# Patient Record
Sex: Male | Born: 1960 | Race: White | Hispanic: No | Marital: Married | Smoking: Never smoker
Health system: Southern US, Community
[De-identification: ages and names within clinical notes are randomized; demographics above are authoritative.]

## PROBLEM LIST (undated history)

## (undated) DIAGNOSIS — E785 Hyperlipidemia, unspecified: Secondary | ICD-10-CM

## (undated) DIAGNOSIS — N289 Disorder of kidney and ureter, unspecified: Secondary | ICD-10-CM

## (undated) HISTORY — PX: FRACTURE SURGERY: SHX138

---

## 1997-05-17 ENCOUNTER — Encounter (HOSPITAL_COMMUNITY): Admission: RE | Admit: 1997-05-17 | Discharge: 1997-08-15 | Payer: Self-pay | Admitting: *Deleted

## 1998-11-02 ENCOUNTER — Encounter: Payer: Self-pay | Admitting: Specialist

## 1998-11-02 ENCOUNTER — Ambulatory Visit (HOSPITAL_COMMUNITY): Admission: RE | Admit: 1998-11-02 | Discharge: 1998-11-02 | Payer: Self-pay | Admitting: Specialist

## 1998-11-19 ENCOUNTER — Other Ambulatory Visit: Admission: RE | Admit: 1998-11-19 | Discharge: 1998-11-19 | Payer: Self-pay | Admitting: Specialist

## 2001-09-08 ENCOUNTER — Emergency Department (HOSPITAL_COMMUNITY): Admission: EM | Admit: 2001-09-08 | Discharge: 2001-09-08 | Payer: Self-pay | Admitting: Emergency Medicine

## 2001-09-08 ENCOUNTER — Encounter: Payer: Self-pay | Admitting: Emergency Medicine

## 2001-09-09 ENCOUNTER — Encounter: Payer: Self-pay | Admitting: Orthopedic Surgery

## 2006-01-18 ENCOUNTER — Encounter: Admission: RE | Admit: 2006-01-18 | Discharge: 2006-01-18 | Payer: Self-pay | Admitting: Specialist

## 2006-07-17 ENCOUNTER — Emergency Department (HOSPITAL_COMMUNITY): Admission: EM | Admit: 2006-07-17 | Discharge: 2006-07-17 | Payer: Self-pay | Admitting: Emergency Medicine

## 2006-07-20 ENCOUNTER — Ambulatory Visit (HOSPITAL_COMMUNITY): Admission: RE | Admit: 2006-07-20 | Discharge: 2006-07-21 | Payer: Self-pay | Admitting: Orthopedic Surgery

## 2007-01-28 ENCOUNTER — Encounter: Admission: RE | Admit: 2007-01-28 | Discharge: 2007-01-28 | Payer: Self-pay | Admitting: Orthopedic Surgery

## 2010-07-01 NOTE — Op Note (Signed)
Rick Contreras, Rick Contreras NO.:  [id number]   MEDICAL RECORD NO.:  [id number]          PATIENT TYPE:  OIB   LOCATION:  5039                         FACILITY:  [facility]   PHYSICIAN:  Dionne Ano. Gramig III, M.D.DATE OF BIRTH:  1960-03-04   DATE OF PROCEDURE:  07/20/2006  DATE OF DISCHARGE:                               OPERATIVE REPORT   PREOPERATIVE DIAGNOSIS:  Galeazzi fracture left forearm with noted prior  wrist open reduction internal fixation and fracture adjacent to retained  hardware in the left wrist.   POSTOPERATIVE DIAGNOSES:  Galeazzi fracture left forearm with noted  prior wrist open reduction internal fixation and fracture adjacent to  retained hardware in the left wrist.   PROCEDURE.:  1. Deep hardware removal left wrist in the form of a previously placed      DVR plate.  2. Open reduction internal fixation with nine hole 3.5 DCP plate left      radius fracture.  3. Manipulation under anesthesia,left forearm and wrist.  4. Extensive superficial radial nerve neurolysis, left forearm.   SURGEON:  Dionne Ano. Amanda Pea, M.D.   ASSISTANT:  None.   COMPLICATIONS:  None.   ANESTHESIA:  Infraclavicular block with IV sedation.   TOURNIQUET TIME:  Less than an hour.   ESTIMATED BLOOD LOSS:  Minimal.   DRAINS:  One.   INDICATIONS FOR PROCEDURE:  This patient is a pleasant male who presents  with the above-mentioned diagnosis.  He has had prior wrist fracture  which he did very well from in terms of healing.  Unfortunately he was  on the soccer field days ago and sustained a blow to the arm and  fracture just above his plate and sustained a resultant Galeazzi  fracture subluxation (radial shaft fracture and distal radial ulnar  joint subluxation).  Following this he presented to my office for  evaluation and treatment.  He understands the risks and benefits of  surgery, need for surgical intervention and various imponderables, pre  and postop routines, etc.  I have discussed with him extensively the  risks of bleeding, infection, anesthesia, damage to normal structures  and failure of surgery to accomplish these intended goals of relieving  symptoms and restoring function.  With this in mind he desires to  proceed.  All questions have been encouraged and answered  preoperatively.   OPERATIVE PROCEDURE:  The patient was seen by myself and anesthesia.  He  was taken to operative suite and underwent smooth induction of IV  sedation.  He was permitted, arm was marked and infraclavicular block  was placed in the holding area.  Following securing a sterile prep and  drape the patient underwent incision about his prior incision centered  over the volar radial wrist region.  This was extended proximally.  Following this, I dissected through scar tissue and identified the  interval between the radial artery and brachioradialis tendon.  The  superficial radial nerve was adhesed with scar tissue and underwent a  distinct and separate neurolysis at this time.  This was an extensive  neurolysis of the superficial radial nerve.  Following this, I then dissected out the radial artery.  The radial  artery had a vessel loop placed around it and was meticulously dissected  and removed out of harm's way.   Following this, I then accessed the deep hardware about the plate.  The  pronator quadratus was lifted off of the plate.  The plate was removed  meticulously and following removal of the DVR plate as well as screws  and pegs I then set this away from the field, irrigated the area and  released a small portion of the pronator.  Pronator was released  proximally and the two ends of the fractured bone were identified.  I  then reduced these and applied a nine hole 3.5 DCP plate under  compression mode.  This was done without difficulty and excellent  purchase with four cortices distally and 3.5 cortices proximally was  obtained.  I did not want to create  any undue stress risers and thus  backed off the most proximal screw a slight bit to achieve primarily uni  cortical fixation.  This allowed for approximately seven cortices of  fixation proximal to the fracture and eight cortices distal to the  fracture.  Following this, I checked this under live fluoro.  The distal  radioulnar joint was stable.  Manipulation under anesthesia, the wrist  forearm and distal radioulnar joint noted excellent stability.  I was  pleased with the findings.  At this time I deflated the tourniquet,  applied an 8 inch (medium) Hemovac drain and next closed the wound in  layers.  Vicryl was placed deeply followed by 3-0 Vicryl in the subcu  and skin clip at the skin edge.  Sterile dressing was applied and he was  placed in a long-arm splint with the arm fully supinated.  He tolerated  this well.  There were no complications.  All sponge and instrument  counts were reported as correct.  He will be admitted for 23-hour  observation for pain management, IV antibiotics and general postop  precautions and measures.  Will monitor his condition closely and look  forward to participating in his postop care of course.           ______________________________  Dionne Ano. Everlene Other, M.D.     Rick Contreras  D:  07/21/2006  T:  07/21/2006  Job:  [id number]

## 2010-07-04 NOTE — Consult Note (Signed)
[place]  Patient:    Rick Contreras, Rick Contreras Visit Number: [id number] MRN: [id number]          Service Type: DFT Location: *N Attending Physician:  Dominica Severin Dictated by:   Sherri Rad, M.D. Proc. Date: 09/09/01                            Consultation Report  ORTHOPEDIC CONSULTATION REPORT  CHIEF COMPLAINT:  Bilateral left greater than right wrist pain.  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  He was then taken to [facility] emergency department where x-rays were obtained and I was consulted for further evaluation and treatment.  The patient had no numbness or tingling in his right hand but had numbness and tingling in his left hand.  No significant medical problems.  PHYSICAL EXAMINATION:  His compartments were soft in his hand, forearm and arm.  Passive range of motion of fingers did not elicit any pain involving the forearm or hand just directly over the fracture site.   He had decreased sensation over the tips of his fingers initially.  Palpable radial pulse, capillary refill less than 2 seconds in all fingers.  He had an obvious extension deformity to the distal radius.  As for the right wrist he was tender over the distal radius, no deformity, some more mild to moderate swelling, mild ecchymoses.  Compartments are soft in hand, forearm and arm. Passive range of motion of fingers did not elicit any pain.  Full radial pulse, sensation intact to light touch over all fingertips.  ACCESSORY CLINICAL DATA:  X-rays revealed a comminuted right extension distal radius fracture with probable interarticular extension.  PROCEDURE:  Under sterile conditions a hematoma block was administered to the left wrist  into the fracture site.  Once this was done a closed reduction of the left distal radius was then performed.  Closed reduction x-rays showed adequate reduction.  Repeat examination post reduction revealed again that the compartments were soft.  Sensation in the fingertips improved and he still had excellent capillary refill.  Fingertips were warm.  Palpable radial pulse. Sugar-tong splint was applied.  IMPRESSION: 1. Left comminuted distal radius fracture. 2. Probable right distal radius fracture non-displaced.  PLAN:  I explained to the patient and his wife that at this point he will most likely require open reduction, internal fixation of his left distal radius fracture.  I am going to review these x-rays as well as the CT scan that we are obtaining with our hand specialist, Dr. Amanda Pea.  He is leaving out of town next week.  If he is unable to do this when he comes back then I will refer this gentleman to one of our local hand specialists.  The patient is to keep this elevated and active range of motion of the fingers is encouraged.  He is to keep it elevated at or above the level of the heart.  He was explained the signs and symptoms of compartment syndrome.  If he develops these symptoms he is to come to the emergency department immediately. Dictated by:   Sherri Rad, M.D. Attending Physician:  Dominica Severin DD:  09/09/01 TD:  09/13/01 Job: [id number]

## 2010-12-04 LAB — CBC
MCHC: 34.4
MCV: 86.2
Platelets: 291
RBC: 4.93
RDW: 12.5

## 2010-12-04 LAB — BASIC METABOLIC PANEL
CO2: 28
Calcium: 9
Chloride: 102

## 2011-01-21 ENCOUNTER — Other Ambulatory Visit: Payer: Self-pay | Admitting: Gastroenterology

## 2011-10-01 ENCOUNTER — Other Ambulatory Visit: Payer: Self-pay | Admitting: Otolaryngology

## 2013-02-01 DIAGNOSIS — F909 Attention-deficit hyperactivity disorder, unspecified type: Secondary | ICD-10-CM

## 2013-02-01 DIAGNOSIS — F411 Generalized anxiety disorder: Secondary | ICD-10-CM

## 2013-02-01 DIAGNOSIS — R413 Other amnesia: Secondary | ICD-10-CM

## 2013-02-01 DIAGNOSIS — F332 Major depressive disorder, recurrent severe without psychotic features: Secondary | ICD-10-CM

## 2013-02-03 DIAGNOSIS — F411 Generalized anxiety disorder: Secondary | ICD-10-CM

## 2013-02-03 DIAGNOSIS — F909 Attention-deficit hyperactivity disorder, unspecified type: Secondary | ICD-10-CM

## 2013-02-03 DIAGNOSIS — R413 Other amnesia: Secondary | ICD-10-CM

## 2013-02-03 DIAGNOSIS — F332 Major depressive disorder, recurrent severe without psychotic features: Secondary | ICD-10-CM

## 2013-02-23 DIAGNOSIS — F332 Major depressive disorder, recurrent severe without psychotic features: Secondary | ICD-10-CM

## 2013-02-23 DIAGNOSIS — R413 Other amnesia: Secondary | ICD-10-CM

## 2013-02-23 DIAGNOSIS — F411 Generalized anxiety disorder: Secondary | ICD-10-CM

## 2013-03-31 ENCOUNTER — Other Ambulatory Visit (HOSPITAL_COMMUNITY): Payer: Self-pay | Admitting: Internal Medicine

## 2013-03-31 DIAGNOSIS — J45990 Exercise induced bronchospasm: Secondary | ICD-10-CM

## 2013-03-31 LAB — PULMONARY FUNCTION TEST
DL/VA % PRED: 88 %
DL/VA: 4.14 ml/min/mmHg/L
DLCO COR: 32.44 ml/min/mmHg
DLCO UNC % PRED: 98 %
DLCO UNC: 32.44 ml/min/mmHg
DLCO cor % pred: 98 %
FEF 25-75 POST: 3.5 L/s
FEF 25-75 PRE: 3.1 L/s
FEF2575-%CHANGE-POST: 12 %
FEF2575-%Pred-Post: 103 %
FEF2575-%Pred-Pre: 91 %
FEV1-%CHANGE-POST: 2 %
FEV1-%PRED-POST: 105 %
FEV1-%PRED-PRE: 102 %
FEV1-POST: 4.1 L
FEV1-Pre: 4 L
FEV1FVC-%Change-Post: 1 %
FEV1FVC-%PRED-PRE: 97 %
FEV6-%CHANGE-POST: 1 %
FEV6-%PRED-PRE: 109 %
FEV6-%Pred-Post: 110 %
FEV6-Post: 5.37 L
FEV6-Pre: 5.32 L
FEV6FVC-%CHANGE-POST: 0 %
FEV6FVC-%PRED-POST: 103 %
FEV6FVC-%Pred-Pre: 103 %
FVC-%CHANGE-POST: 1 %
FVC-%Pred-Post: 106 %
FVC-%Pred-Pre: 105 %
FVC-Post: 5.41 L
FVC-Pre: 5.34 L
PRE FEV1/FVC RATIO: 75 %
Post FEV1/FVC ratio: 76 %
Post FEV6/FVC ratio: 99 %
Pre FEV6/FVC Ratio: 100 %
RV % pred: 174 %
RV: 3.7 L
TLC % PRED: 130 %
TLC: 9.24 L

## 2013-04-10 ENCOUNTER — Ambulatory Visit (HOSPITAL_COMMUNITY)
Admission: RE | Admit: 2013-04-10 | Discharge: 2013-04-10 | Disposition: A | Discharge status: Home / Self Care | Payer: 59 | Source: Ambulatory Visit | Attending: Internal Medicine | Admitting: Internal Medicine

## 2013-04-10 ENCOUNTER — Inpatient Hospital Stay (HOSPITAL_COMMUNITY)
Admission: RE | Admit: 2013-04-10 | Discharge: 2013-04-10 | Disposition: A | Discharge status: Home / Self Care | Payer: 59 | Source: Ambulatory Visit

## 2013-04-10 DIAGNOSIS — J45909 Unspecified asthma, uncomplicated: Secondary | ICD-10-CM | POA: Insufficient documentation

## 2013-04-10 MED ORDER — ALBUTEROL SULFATE (2.5 MG/3ML) 0.083% IN NEBU
2.5000 mg | INHALATION_SOLUTION | Freq: Once | RESPIRATORY_TRACT | Status: AC
  Administered 2013-04-10: 2.5 mg via RESPIRATORY_TRACT

## 2015-03-06 ENCOUNTER — Other Ambulatory Visit: Payer: Self-pay | Admitting: Internal Medicine

## 2015-03-06 DIAGNOSIS — R112 Nausea with vomiting, unspecified: Secondary | ICD-10-CM

## 2015-03-07 ENCOUNTER — Ambulatory Visit (HOSPITAL_COMMUNITY)
Admission: RE | Admit: 2015-03-07 | Discharge: 2015-03-07 | Disposition: A | Discharge status: Home / Self Care | Payer: 59 | Source: Ambulatory Visit | Attending: Internal Medicine | Admitting: Internal Medicine

## 2015-03-07 DIAGNOSIS — R160 Hepatomegaly, not elsewhere classified: Secondary | ICD-10-CM | POA: Insufficient documentation

## 2015-03-07 DIAGNOSIS — R112 Nausea with vomiting, unspecified: Secondary | ICD-10-CM | POA: Diagnosis present

## 2016-03-31 ENCOUNTER — Other Ambulatory Visit: Payer: Self-pay | Admitting: Internal Medicine

## 2016-03-31 DIAGNOSIS — I701 Atherosclerosis of renal artery: Secondary | ICD-10-CM

## 2016-04-07 ENCOUNTER — Ambulatory Visit
Admission: RE | Admit: 2016-04-07 | Discharge: 2016-04-07 | Disposition: A | Discharge status: Home / Self Care | Payer: 59 | Source: Ambulatory Visit | Attending: Internal Medicine | Admitting: Internal Medicine

## 2016-04-07 ENCOUNTER — Ambulatory Visit
Admission: RE | Admit: 2016-04-07 | Discharge: 2016-04-07 | Disposition: A | Discharge status: Home / Self Care | Payer: BLUE CROSS/BLUE SHIELD | Source: Ambulatory Visit | Attending: Internal Medicine | Admitting: Internal Medicine

## 2016-04-07 DIAGNOSIS — I701 Atherosclerosis of renal artery: Secondary | ICD-10-CM

## 2016-10-21 ENCOUNTER — Emergency Department (HOSPITAL_COMMUNITY)
Admission: EM | Admit: 2016-10-21 | Discharge: 2016-10-21 | Disposition: A | Discharge status: Home / Self Care | Payer: BLUE CROSS/BLUE SHIELD | Attending: Emergency Medicine | Admitting: Emergency Medicine

## 2016-10-21 ENCOUNTER — Encounter (HOSPITAL_COMMUNITY): Payer: Self-pay | Admitting: Emergency Medicine

## 2016-10-21 ENCOUNTER — Emergency Department (HOSPITAL_COMMUNITY): Payer: BLUE CROSS/BLUE SHIELD

## 2016-10-21 DIAGNOSIS — M545 Low back pain: Secondary | ICD-10-CM | POA: Diagnosis not present

## 2016-10-21 DIAGNOSIS — N200 Calculus of kidney: Secondary | ICD-10-CM | POA: Diagnosis not present

## 2016-10-21 DIAGNOSIS — R1032 Left lower quadrant pain: Secondary | ICD-10-CM | POA: Diagnosis present

## 2016-10-21 HISTORY — DX: Hyperlipidemia, unspecified: E78.5

## 2016-10-21 HISTORY — DX: Disorder of kidney and ureter, unspecified: N28.9

## 2016-10-21 LAB — CBC WITH DIFFERENTIAL/PLATELET
Basophils Absolute: 0 10*3/uL (ref 0.0–0.1)
Basophils Relative: 0 %
Eosinophils Absolute: 0 10*3/uL (ref 0.0–0.7)
Eosinophils Relative: 1 %
HCT: 43 % (ref 39.0–52.0)
Hemoglobin: 14.4 g/dL (ref 13.0–17.0)
Lymphocytes Relative: 8 %
Lymphs Abs: 0.6 10*3/uL — ABNORMAL LOW (ref 0.7–4.0)
MCH: 28.9 pg (ref 26.0–34.0)
MCHC: 33.5 g/dL (ref 30.0–36.0)
MCV: 86.2 fL (ref 78.0–100.0)
Monocytes Absolute: 0.5 10*3/uL (ref 0.1–1.0)
Monocytes Relative: 7 %
Neutro Abs: 6.9 10*3/uL (ref 1.7–7.7)
Neutrophils Relative %: 84 %
Platelets: 211 10*3/uL (ref 150–400)
RBC: 4.99 MIL/uL (ref 4.22–5.81)
RDW: 13.4 % (ref 11.5–15.5)
WBC: 8.1 10*3/uL (ref 4.0–10.5)

## 2016-10-21 LAB — URINALYSIS, ROUTINE W REFLEX MICROSCOPIC
Bilirubin Urine: NEGATIVE
Glucose, UA: NEGATIVE mg/dL
Ketones, ur: NEGATIVE mg/dL
Leukocytes, UA: NEGATIVE
Nitrite: NEGATIVE
Protein, ur: NEGATIVE mg/dL
Specific Gravity, Urine: 1.025 (ref 1.005–1.030)
pH: 5 (ref 5.0–8.0)

## 2016-10-21 LAB — BASIC METABOLIC PANEL
Anion gap: 8 (ref 5–15)
BUN: 24 mg/dL — ABNORMAL HIGH (ref 6–20)
CO2: 24 mmol/L (ref 22–32)
Calcium: 8.8 mg/dL — ABNORMAL LOW (ref 8.9–10.3)
Chloride: 107 mmol/L (ref 101–111)
Creatinine, Ser: 1.35 mg/dL — ABNORMAL HIGH (ref 0.61–1.24)
GFR calc Af Amer: >60 mL/min (ref >60)
GFR calc non Af Amer: 57 mL/min — ABNORMAL LOW (ref >60)
Glucose, Bld: 132 mg/dL — ABNORMAL HIGH (ref 65–99)
Potassium: 4.2 mmol/L (ref 3.5–5.1)
Sodium: 139 mmol/L (ref 135–145)

## 2016-10-21 MED ORDER — OXYCODONE-ACETAMINOPHEN 5-325 MG PO TABS
1.0000 | ORAL_TABLET | Freq: Once | ORAL | Status: AC
  Administered 2016-10-21: 1 via ORAL
  Filled 2016-10-21: qty 1

## 2016-10-21 MED ORDER — KETOROLAC TROMETHAMINE 30 MG/ML IJ SOLN
30.0000 mg | Freq: Once | INTRAMUSCULAR | Status: AC
  Administered 2016-10-21: 30 mg via INTRAVENOUS
  Filled 2016-10-21: qty 1

## 2016-10-21 MED ORDER — OXYCODONE-ACETAMINOPHEN 5-325 MG PO TABS: 1.0000 | ORAL_TABLET | ORAL | 0 refills | Status: AC | PRN

## 2016-10-21 MED ORDER — KETOROLAC TROMETHAMINE 10 MG PO TABS: 10.0000 mg | ORAL_TABLET | Freq: Four times a day (QID) | ORAL | 0 refills | Status: AC | PRN

## 2016-10-21 MED ORDER — HYDROMORPHONE HCL 1 MG/ML IJ SOLN
1.0000 mg | Freq: Once | INTRAMUSCULAR | Status: AC
  Administered 2016-10-21: 1 mg via INTRAVENOUS
  Filled 2016-10-21: qty 1

## 2016-10-21 NOTE — ED Triage Notes (Signed)
PT has severe left flank pain that radiates to front of abdomen. No history of kidney stones. PT reports sudden onset of pain this morning.   PT was given of fentanyl in route. PT also had a 4mg  ODT zofran in route.

## 2016-10-21 NOTE — ED Provider Notes (Signed)
[facility]EMERGENCY DEPT Provider Note   CSN: [id number] Arrival date & time: 10/21/16  1036     History   Chief Complaint Chief Complaint  Patient presents with  . Flank Pain    HPI Rick Contreras is a 56 y.o. male.  HPI    64 YOM presents today with severe left flank pain.  Patient notes that over the last several days he has had left lower back pain with radiation to his leg.  He notes a history of sciatica.  Patient notes that this morning he had acute onset of left flank pain with radiation down into his groin and penis.  He notes this is different from his back pain.  He denies any history of the same.  He denies any dysuria, fever.  He notes nausea and vomiting after onset of pain.  Patient denies any significant abdominal pain.   Prior to arrival patient received fentanyl in route which did not improve his symptoms.  Past Medical History:  Diagnosis Date  . Hyperlipidemia   . Renal disorder     There are no active problems to display for this patient.   Past Surgical History:  Procedure Laterality Date  . FRACTURE SURGERY       Home Medications    Prior to Admission medications   Medication Sig Start Date End Date Taking? Authorizing Provider  ketorolac (TORADOL) 10 MG tablet Take 1 tablet (10 mg total) by mouth every 6 (six) hours as needed. 10/21/16   Edouard Gikas, Tinnie Gens, PA-C  oxyCODONE-acetaminophen (PERCOCET/ROXICET) 5-325 MG tablet Take 1 tablet by mouth every 4 (four) hours as needed for severe pain. 10/21/16   Eyvonne Mechanic, PA-C    Family History No family history on file.  Social History Social History  Substance Use Topics  . Smoking status: Never Smoker  . Smokeless tobacco: Never Used  . Alcohol use No     Allergies   Patient has no known allergies.   Review of Systems Review of Systems  All other systems reviewed and are negative.   Physical Exam Updated Vital Signs BP 121/88   Pulse 66   Resp 20   Wt 106.6 kg (235 lb)   SpO2 98%     Physical Exam  Constitutional: He is oriented to person, place, and time. He appears well-developed and well-nourished.  Patient writhing in exam bed  HENT:  Head: Normocephalic and atraumatic.  Eyes: Pupils are equal, round, and reactive to light. Conjunctivae are normal. Right eye exhibits no discharge. Left eye exhibits no discharge. No scleral icterus.  Neck: Normal range of motion. No JVD present. No tracheal deviation present.  Pulmonary/Chest: Effort normal. No stridor.  Abdominal: Soft. He exhibits no distension and no mass. There is no tenderness. There is no rebound and no guarding. No hernia.  Neurological: He is alert and oriented to person, place, and time. Coordination normal.  Psychiatric: He has a normal mood and affect. His behavior is normal. Judgment and thought content normal.  Nursing note and vitals reviewed.   ED Treatments / Results  Labs (all labs ordered are listed, but only abnormal results are displayed) Labs Reviewed  CBC WITH DIFFERENTIAL/PLATELET - Abnormal; Notable for the following:       Result Value   Lymphs Abs 0.6 (*)    All other components within normal limits  BASIC METABOLIC PANEL - Abnormal; Notable for the following:    Glucose, Bld 132 (*)    BUN 24 (*)    Creatinine,  Ser 1.35 (*)    Calcium 8.8 (*)    GFR calc non Af Amer 57 (*)    All other components within normal limits  URINALYSIS, ROUTINE W REFLEX MICROSCOPIC - Abnormal; Notable for the following:    APPearance HAZY (*)    Hgb urine dipstick LARGE (*)    Bacteria, UA RARE (*)    Squamous Epithelial / LPF 0-5 (*)    All other components within normal limits    EKG  EKG Interpretation None       Radiology Ct Renal Stone Study  Result Date: 10/21/2016 CLINICAL DATA:  Left flank pain.  Left lower quadrant pain. EXAM: CT ABDOMEN AND PELVIS WITHOUT CONTRAST TECHNIQUE: Multidetector CT imaging of the abdomen and pelvis was performed following the standard protocol without IV  contrast. COMPARISON:  04/07/2016 renal ultrasound.  No prior CT. FINDINGS: Lower chest: 3 mm right middle lobe pulmonary nodule on image 6/series 5. A 3 mm subpleural right lower lobe pulmonary nodule on image 20/series 5. Normal heart size without pericardial or pleural effusion. Hepatobiliary: Mild hepatic steatosis with sparing adjacent the gallbladder. Normal gallbladder, without biliary ductal dilatation. Pancreas: Normal, without mass or ductal dilatation. Spleen: Normal in size, without focal abnormality. Adrenals/Urinary Tract: Normal adrenal glands. No renal calculi or hydronephrosis. Subtle left ureteric dilatation with a punctate stone identified in the left bladder base including on coronal image 76 and transverse image 86. Stomach/Bowel: Normal stomach, without wall thickening. Scattered colonic diverticula. Normal terminal ileum and appendix. Normal small bowel. Vascular/Lymphatic: Aortic atherosclerosis. No abdominopelvic adenopathy. Reproductive: Normal prostate. Other: Small bilateral fat containing inguinal hernias. No significant free fluid. Musculoskeletal: No acute osseous abnormality. IMPRESSION: 1. Left bladder base punctate stone. Given clinical history and subtle left hydroureter, likely recently passed from the left sided system. 2.  Aortic Atherosclerosis (ICD10-I70.0). 3. Hepatic steatosis. 4. Small right-sided pulmonary nodules. No follow-up needed if patient is low-risk. Non-contrast chest CT can be considered in 12 months if patient is high-risk. This recommendation follows the consensus statement: Guidelines for Management of Incidental Pulmonary Nodules Detected on CT Images: From the Fleischner Society 2017; Radiology 2017; 284:228-243. Electronically Signed   By: Jeronimo Greaves M.D.   On: 10/21/2016 11:54    Procedures Procedures (including critical care time)  Medications Ordered in ED Medications  ketorolac (TORADOL) 30 MG/ML injection 30 mg (30 mg Intravenous Given 10/21/16  1042)  HYDROmorphone (DILAUDID) injection 1 mg (1 mg Intravenous Given 10/21/16 1145)  oxyCODONE-acetaminophen (PERCOCET/ROXICET) 5-325 MG per tablet 1 tablet (1 tablet Oral Given 10/21/16 1323)     Initial Impression / Assessment and Plan / ED Course  I have reviewed the triage vital signs and the nursing notes.  Pertinent labs & imaging results that were available during my care of the patient were reviewed by me and considered in my medical decision making (see chart for details).     Final Clinical Impressions(s) / ED Diagnoses   Final diagnoses:  Kidney stones     Labs: CBC, BMP, urinalysis  Imaging: CT renal  Consults:  Therapeutics: Toradol (fentanyl prior to arrival)  Discharge Meds: Percocet, Toradol  Assessment/Plan: 56 year old male presents today with likely nephrolithiasis.  Patient had improvement in symptoms with Toradol here in the ED.  Patient was also given Dilaudid and Percocet.  Patient has what appears to be a passed stone as evidenced by CT findings.  Patient has no significant findings including hydronephrosis or urinary tract infection.  His pain is improved, he will be  discharged home on the above pain medication.  Patient had slight elevation in creatinine, he will follow-up as an outpatient for reassessment.  All CT findings were read to patient including incidental findings he will follow-up with his primary care for these.  Patient verbalized understanding and agreement to today's plan had no further questions or concerns the time discharge.     New Prescriptions New Prescriptions   KETOROLAC (TORADOL) 10 MG TABLET    Take 1 tablet (10 mg total) by mouth every 6 (six) hours as needed.   OXYCODONE-ACETAMINOPHEN (PERCOCET/ROXICET) 5-325 MG TABLET    Take 1 tablet by mouth every 4 (four) hours as needed for severe pain.     Eyvonne MechanicHedges, Marcey Persad, PA-C 10/21/16 1426    Donnetta Hutchingook, Brian, MD 10/24/16 [phone]

## 2016-10-21 NOTE — Discharge Instructions (Signed)
Please read attached information. If you experience any new or worsening signs or symptoms please return to the emergency room for evaluation. Please follow-up with your primary care provider or specialist as discussed. Please use medication prescribed only as directed and discontinue taking if you have any concerning signs or symptoms.   °

## 2016-10-21 NOTE — ED Notes (Signed)
Patient transported to CT 

## 2016-12-15 ENCOUNTER — Ambulatory Visit
Admission: RE | Admit: 2016-12-15 | Discharge: 2016-12-15 | Disposition: A | Discharge status: Home / Self Care | Payer: BLUE CROSS/BLUE SHIELD | Source: Ambulatory Visit | Attending: Internal Medicine | Admitting: Internal Medicine

## 2016-12-15 ENCOUNTER — Other Ambulatory Visit: Payer: Self-pay | Admitting: Internal Medicine

## 2016-12-15 DIAGNOSIS — M545 Low back pain: Secondary | ICD-10-CM

## 2017-09-26 IMAGING — CT CT RENAL STONE PROTOCOL
2 of 4 series · 16 of 46 positions shown, 18 images · non-contrast
Comparison: 04/07/2016 renal ultrasound.  No prior CT.

CLINICAL DATA: Left flank pain.  Left lower quadrant pain.

EXAM:
CT ABDOMEN AND PELVIS WITHOUT CONTRAST
TECHNIQUE: Multidetector CT imaging of the abdomen and pelvis was performed
following the standard protocol without IV contrast.

[Series 3: ap without · axial · non-contrast · 0.80mm/px · z∈[+764,+1239]mm · 13 of 109 slices shown, 15 images]
[im 7/109  soft-tissue]
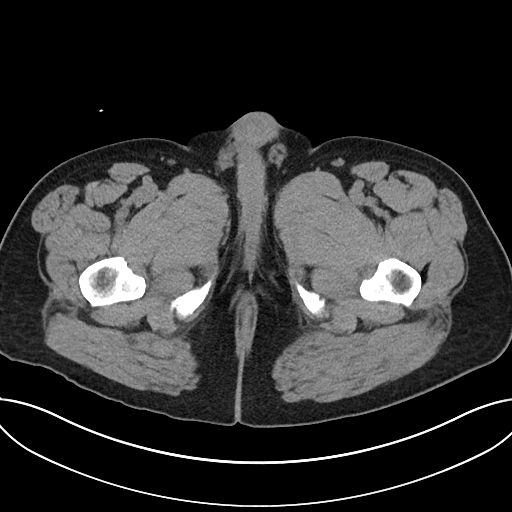
[im 7/109  bone]
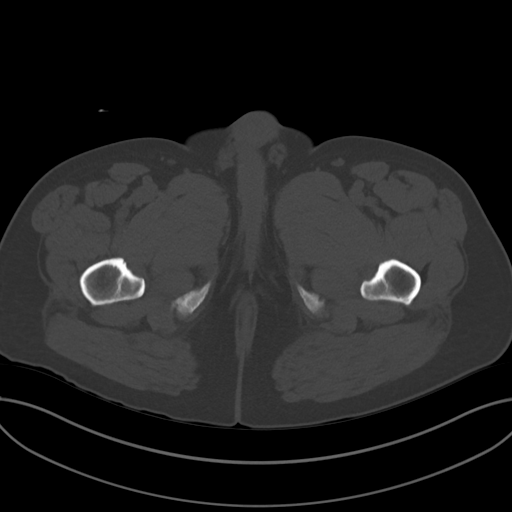
[im 13/109  soft-tissue]
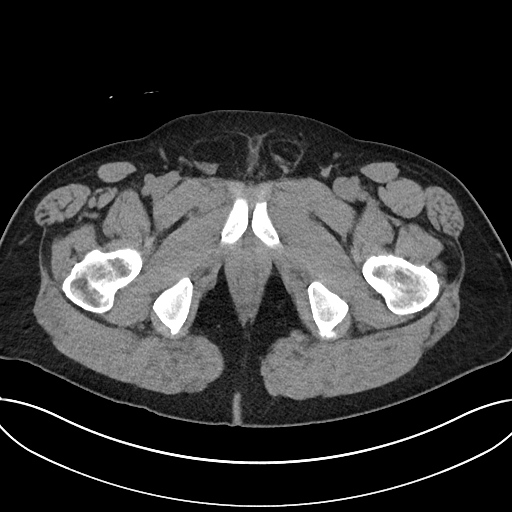
[im 26/109  soft-tissue]
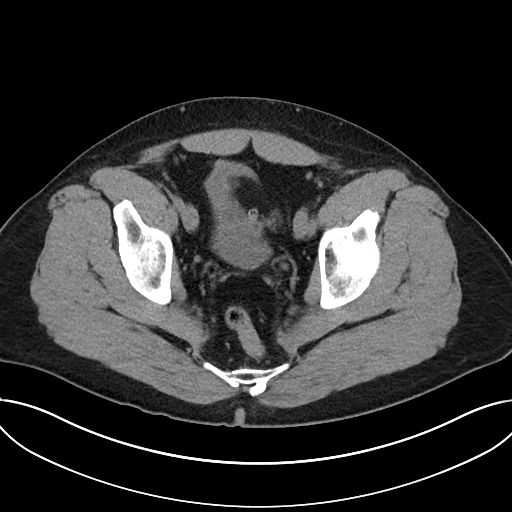
[im 32/109  soft-tissue]
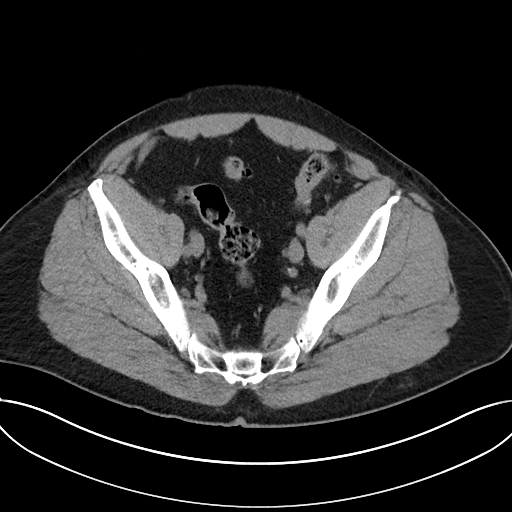
[im 39/109  soft-tissue]
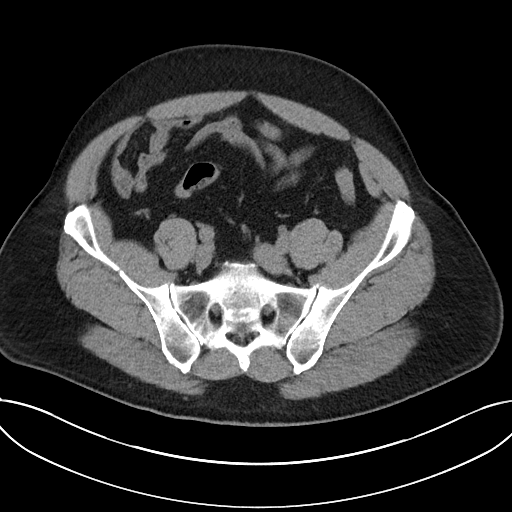
[im 45/109  soft-tissue]
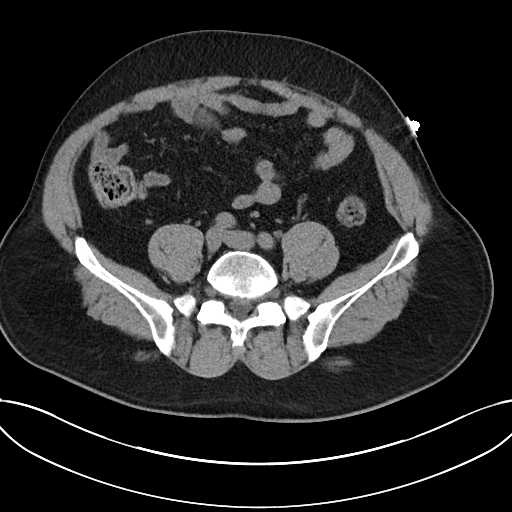
[im 58/109  soft-tissue]
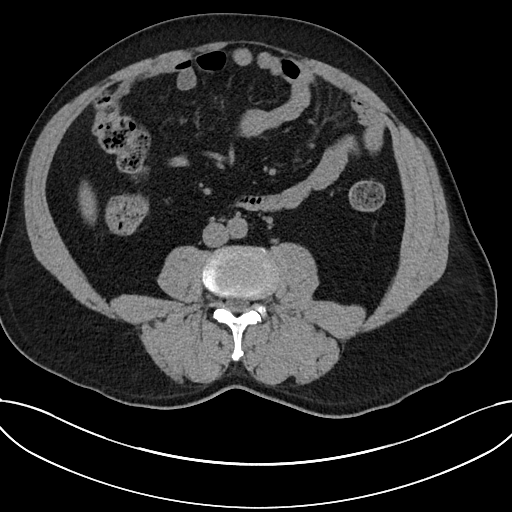
[im 64/109  soft-tissue]
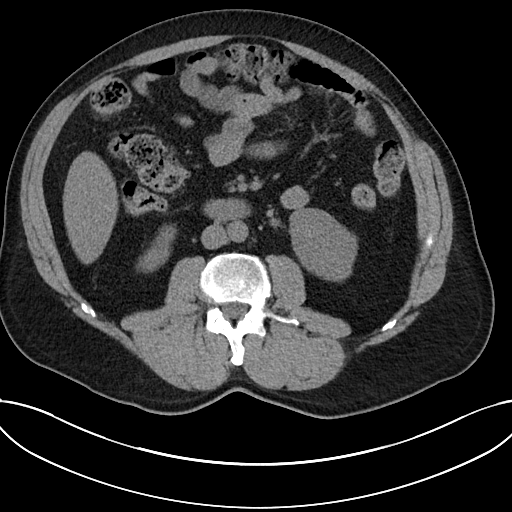
[im 70/109  soft-tissue]
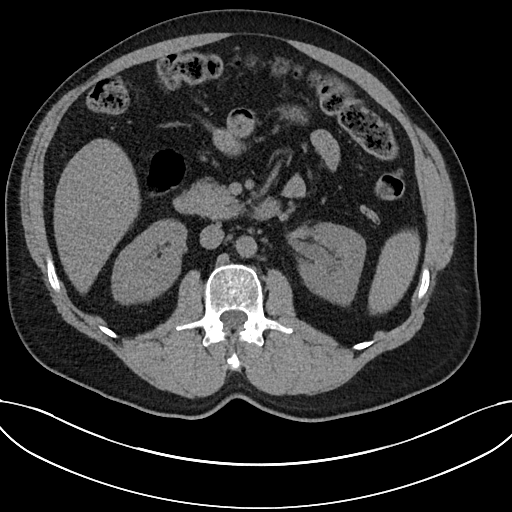
[im 70/109  bone]
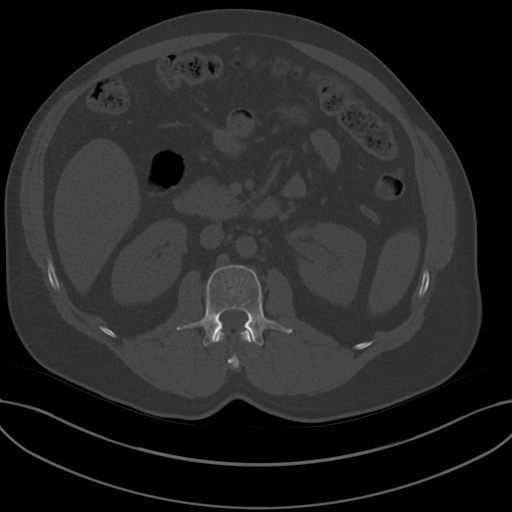
[im 77/109  soft-tissue]
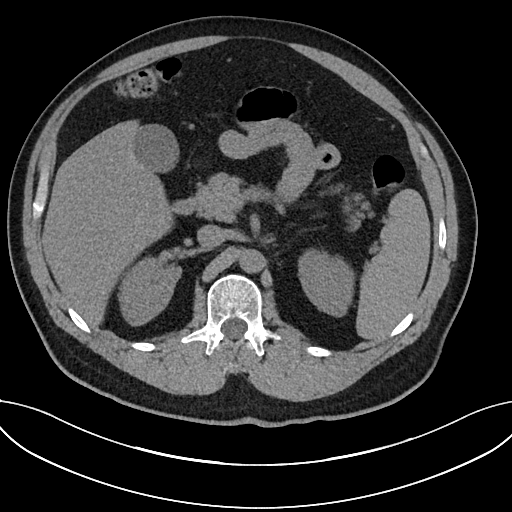
[im 83/109  soft-tissue]
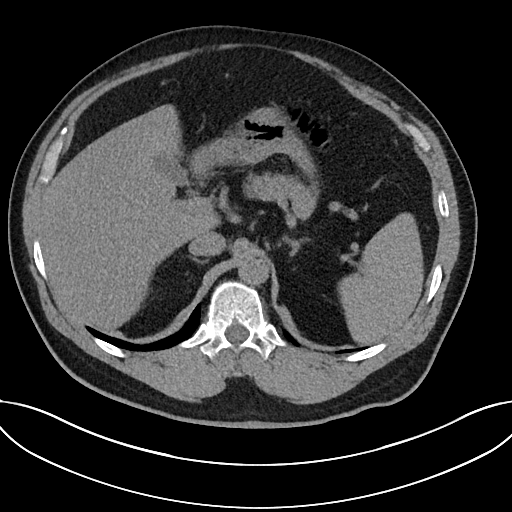
[im 96/109  soft-tissue]
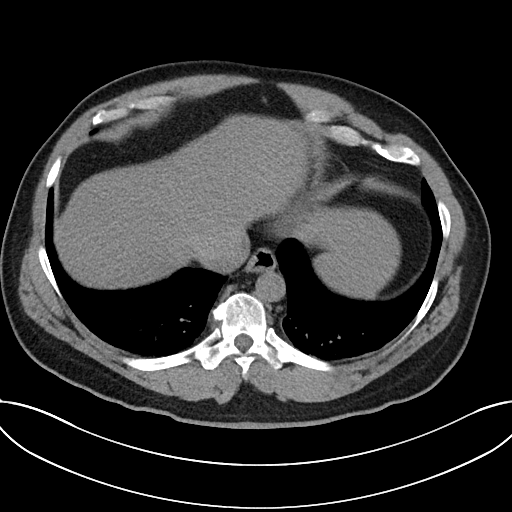
[im 102/109  soft-tissue]
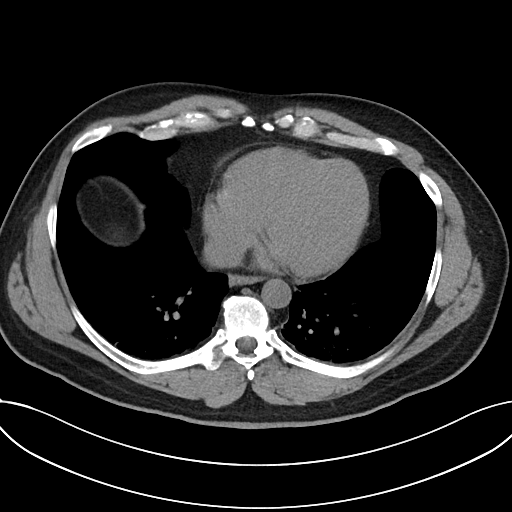

[Series 6: cor · coronal · 0.92mm/px · 3 of 123 slices shown]
[im 41/123  soft-tissue]
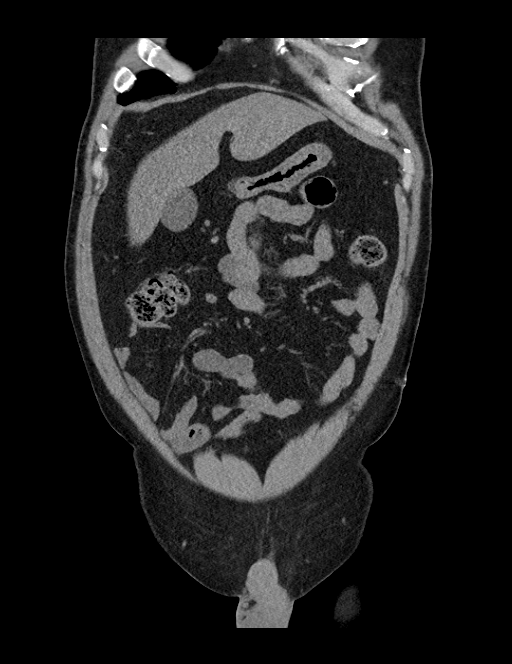
[im 55/123  soft-tissue]
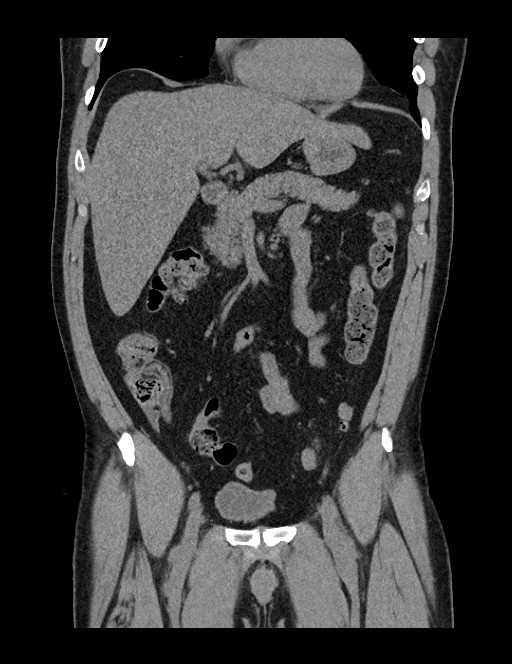
[im 68/123  soft-tissue]
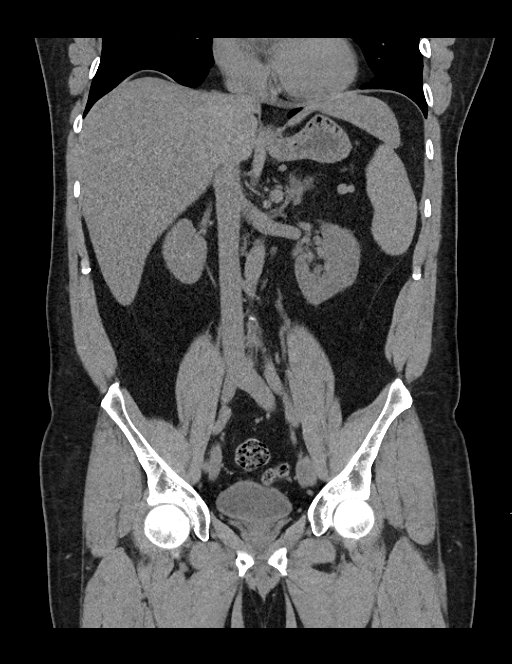

[16 of 46 positions shown; findings below may reference images not displayed]

FINDINGS: Lower chest: 3 mm right middle lobe pulmonary nodule on image
6/series 5. A 3 mm subpleural right lower lobe pulmonary nodule on
image 20/series 5. Normal heart size without pericardial or pleural
effusion.

Hepatobiliary: Mild hepatic steatosis with sparing adjacent the
gallbladder. Normal gallbladder, without biliary ductal dilatation.

Pancreas: Normal, without mass or ductal dilatation.

Spleen: Normal in size, without focal abnormality.

Adrenals/Urinary Tract: Normal adrenal glands. No renal calculi or
hydronephrosis. Subtle left ureteric dilatation with a punctate
stone identified in the left bladder base including on coronal image
76 and transverse image 86.

Stomach/Bowel: Normal stomach, without wall thickening. Scattered
colonic diverticula. Normal terminal ileum and appendix. Normal
small bowel.

Vascular/Lymphatic: Aortic atherosclerosis. No abdominopelvic
adenopathy.

Reproductive: Normal prostate.

Other: Small bilateral fat containing inguinal hernias. No
significant free fluid.

Musculoskeletal: No acute osseous abnormality.
IMPRESSION: 1. Left bladder base punctate stone. Given clinical history and
subtle left hydroureter, likely recently passed from the left sided
system.
2.  Aortic Atherosclerosis ([id number]).
3. Hepatic steatosis.
4. Small right-sided pulmonary nodules. No follow-up needed if
patient is low-risk. Non-contrast chest CT can be considered in 12
months if patient is high-risk. This recommendation follows the
consensus statement: Guidelines for Management of Incidental
Pulmonary Nodules Detected on CT Images: From the [HOSPITAL]

## 2017-11-20 IMAGING — DX DG SI JOINTS 3+V
3 series · 3 of 3 positions shown · non-contrast
Comparison: Coronal and sagittal CT images from a scan of October 21, 2016.

CLINICAL DATA: Intermittent low back pain for the past 3 months. No
previous history of injury

EXAM:
BILATERAL SACROILIAC JOINTS - 3+ VIEW

[dg si joints (1 of 3)]
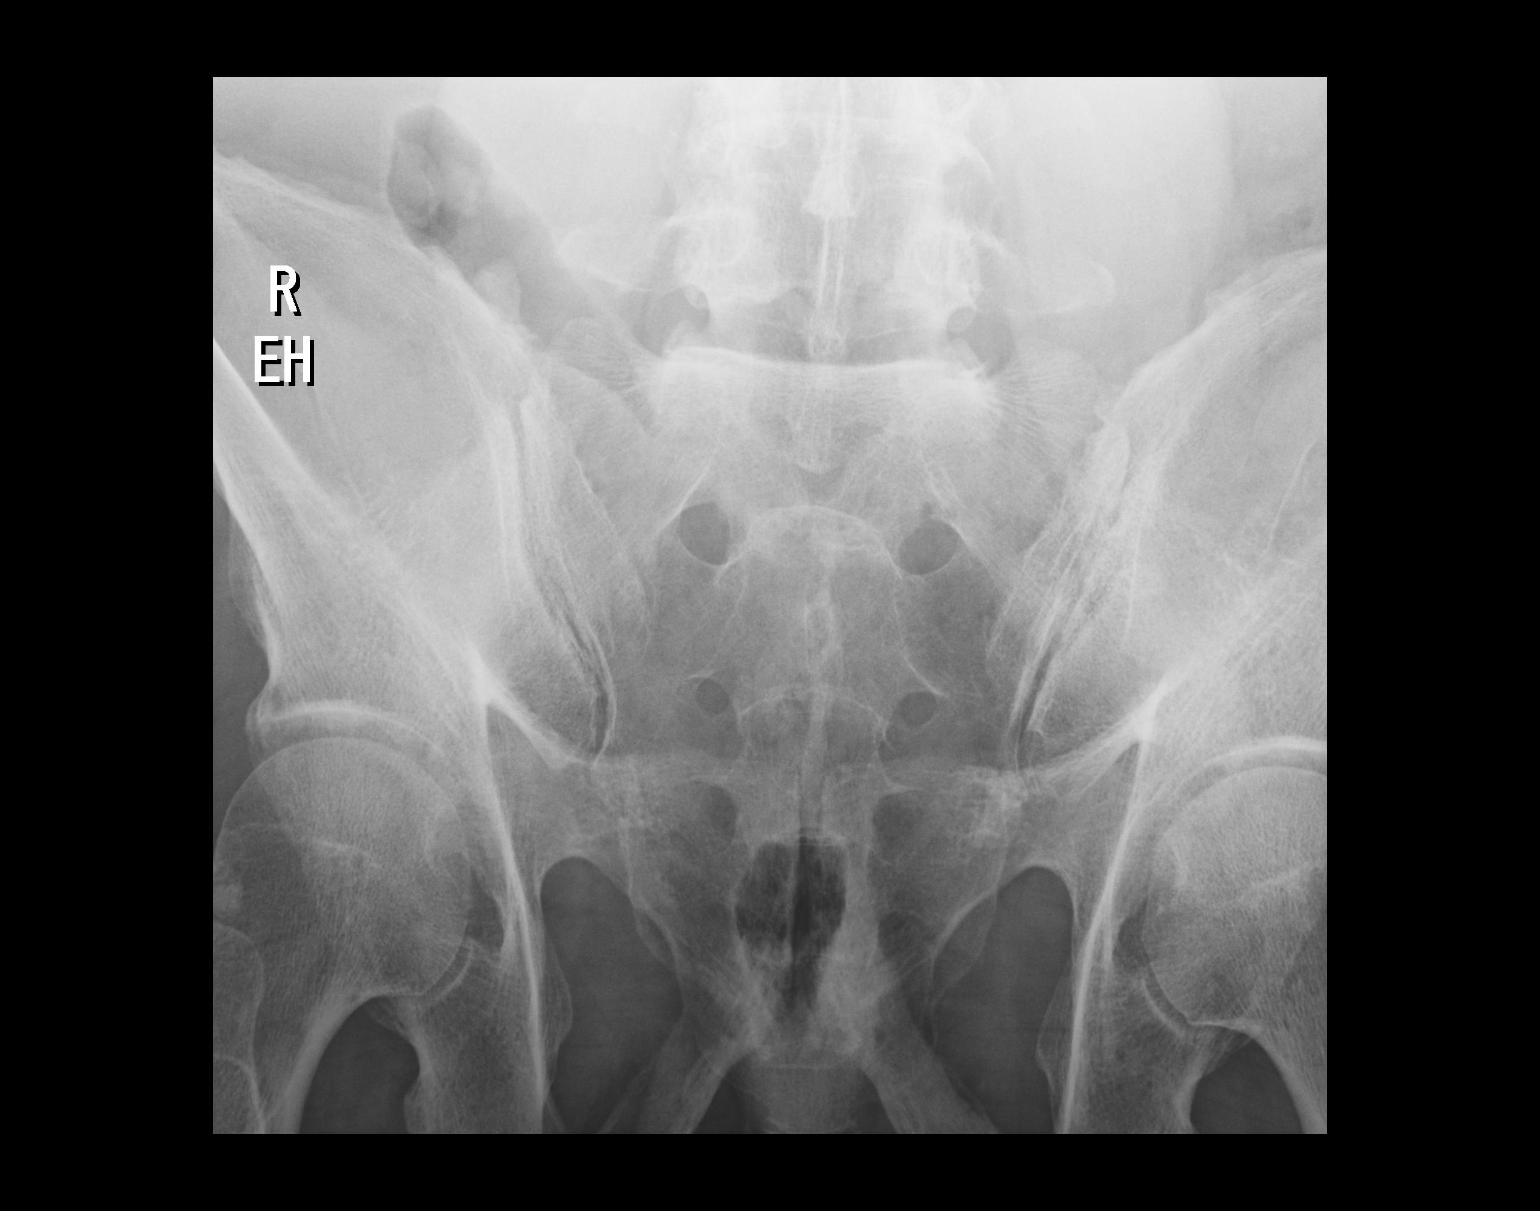

[dg si joints (2 of 3)]
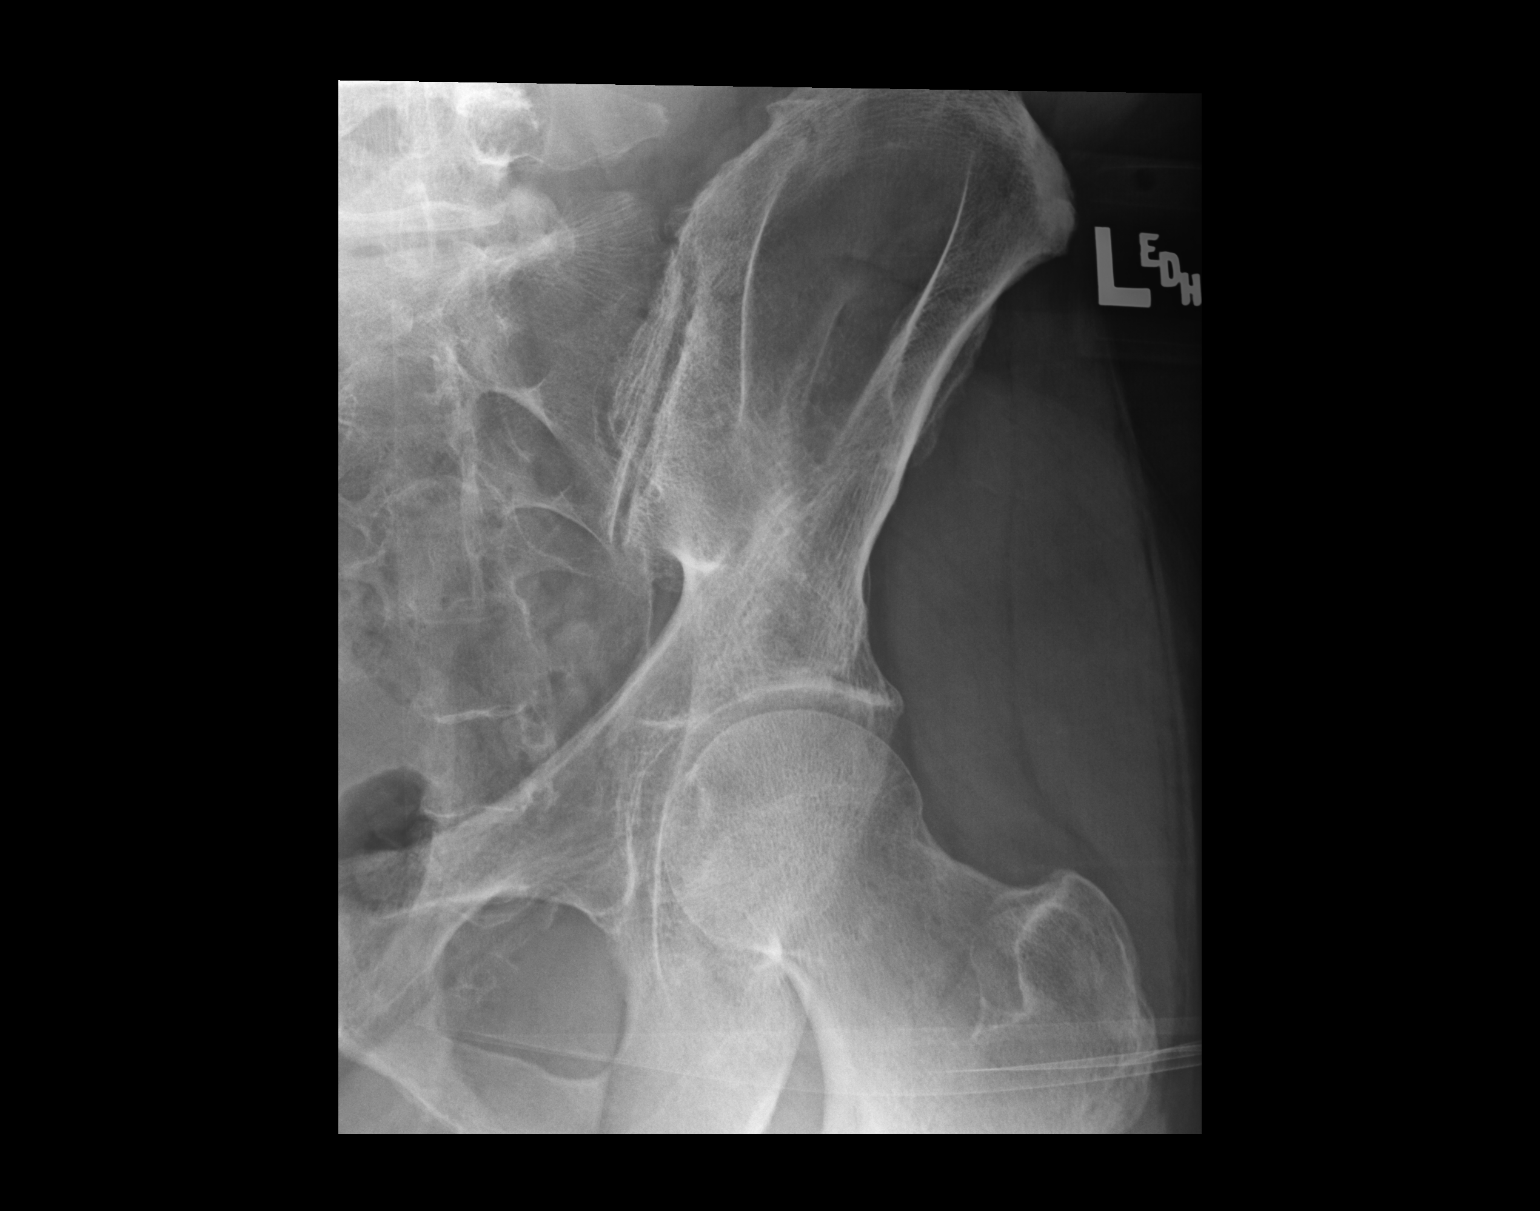

[dg si joints (3 of 3)]
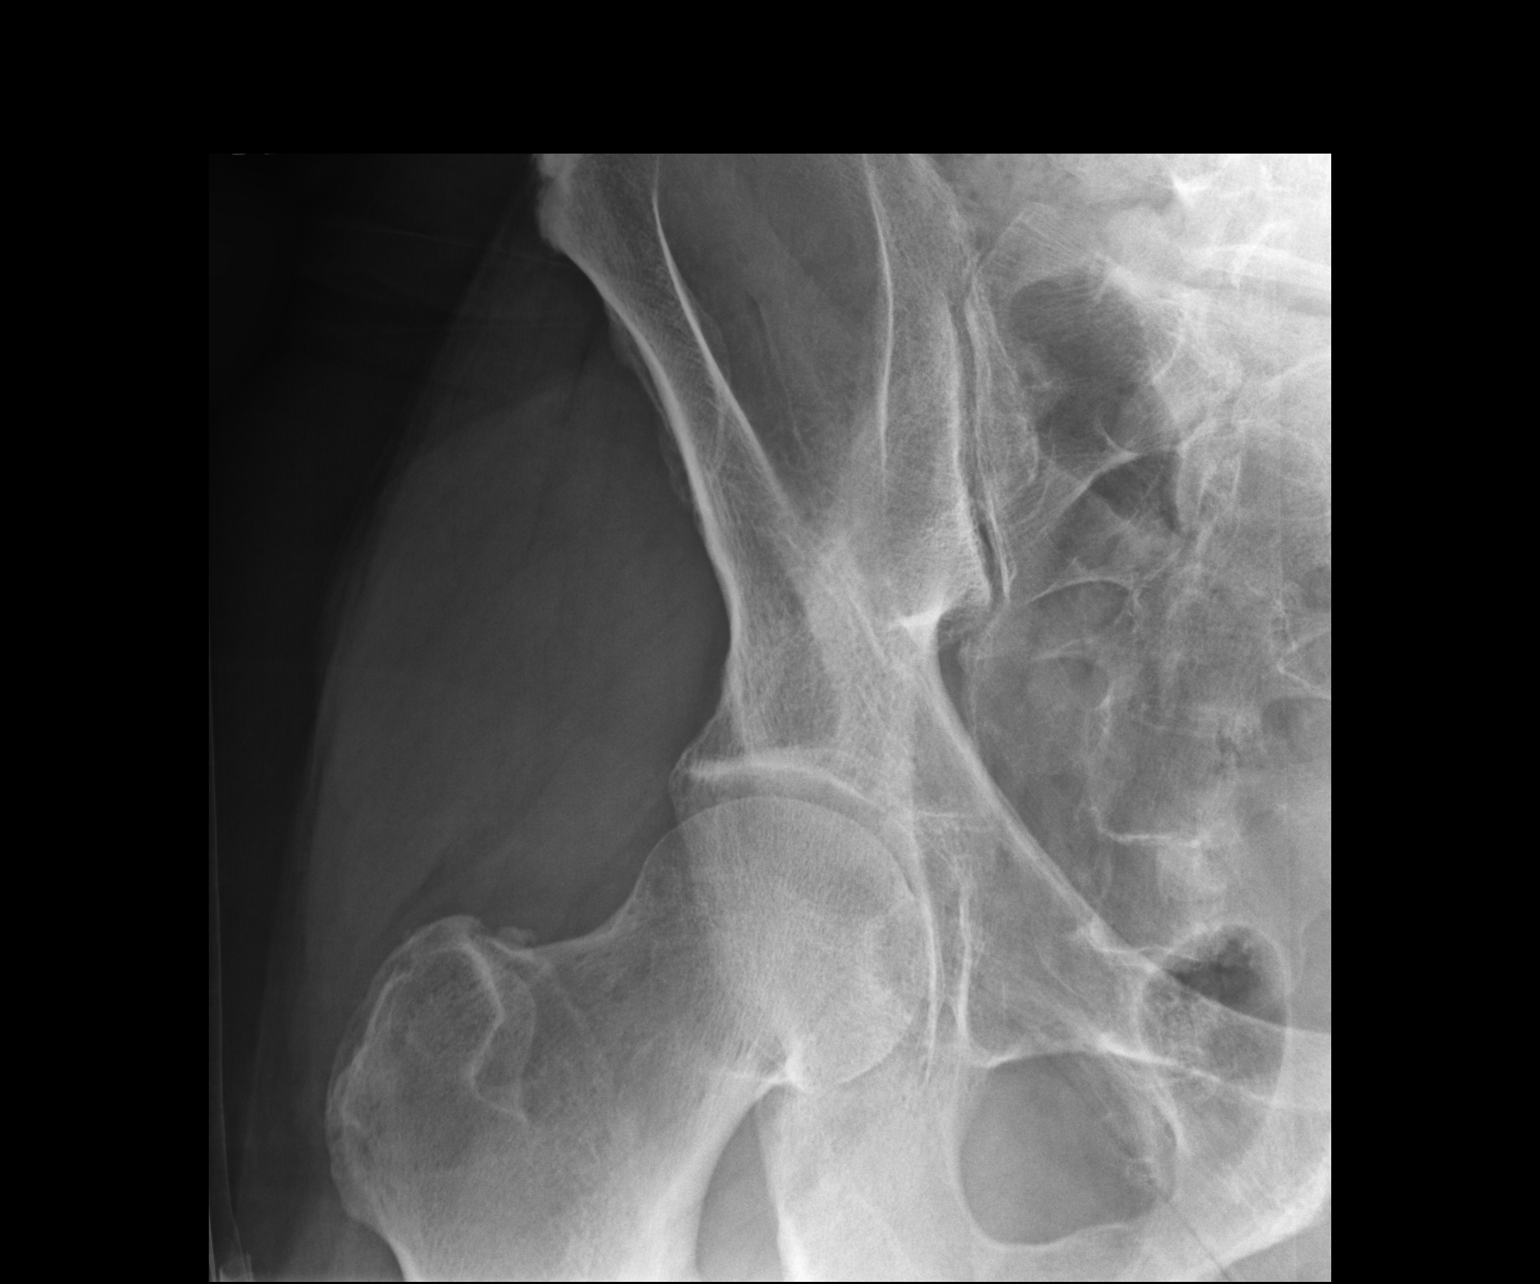

[3 of 3 positions shown; findings below may reference images not displayed]

FINDINGS: The SI joint spaces are well maintained. No erosive changes or bony
bridging is observed. There is no lytic nor blastic lesion. The
observed portions of the sacrum are normal.
IMPRESSION: No acute or significant chronic abnormality of the SI joints.

## 2023-03-31 ENCOUNTER — Encounter: Payer: Self-pay | Admitting: General Practice

## 2023-04-01 ENCOUNTER — Other Ambulatory Visit: Payer: Self-pay | Admitting: Family Medicine

## 2023-04-01 DIAGNOSIS — G4731 Primary central sleep apnea: Secondary | ICD-10-CM

## 2023-04-13 ENCOUNTER — Encounter: Payer: Self-pay | Admitting: Family Medicine

## 2023-04-16 ENCOUNTER — Other Ambulatory Visit: Payer: BLUE CROSS/BLUE SHIELD

## 2023-04-19 ENCOUNTER — Other Ambulatory Visit: Payer: BLUE CROSS/BLUE SHIELD

## 2023-05-10 ENCOUNTER — Other Ambulatory Visit
